# Patient Record
Sex: Female | Born: 1975 | Race: White | Hispanic: No | Marital: Married | Smoking: Former smoker
Health system: Southern US, Community
[De-identification: ages and names within clinical notes are randomized; demographics above are authoritative.]

## PROBLEM LIST (undated history)

## (undated) DIAGNOSIS — M1990 Unspecified osteoarthritis, unspecified site: Secondary | ICD-10-CM

## (undated) DIAGNOSIS — G4700 Insomnia, unspecified: Secondary | ICD-10-CM

## (undated) DIAGNOSIS — C801 Malignant (primary) neoplasm, unspecified: Secondary | ICD-10-CM

## (undated) DIAGNOSIS — E079 Disorder of thyroid, unspecified: Secondary | ICD-10-CM

## (undated) DIAGNOSIS — E063 Autoimmune thyroiditis: Secondary | ICD-10-CM

## (undated) DIAGNOSIS — M069 Rheumatoid arthritis, unspecified: Secondary | ICD-10-CM

## (undated) HISTORY — PX: CARPAL TUNNEL RELEASE: SHX101

## (undated) HISTORY — PX: CERVICAL FUSION: SHX112

## (undated) HISTORY — PX: SHOULDER DEBRIDEMENT: SHX1052

## (undated) HISTORY — PX: ABDOMINAL HYSTERECTOMY: SHX81

---

## 2004-08-06 ENCOUNTER — Emergency Department (HOSPITAL_COMMUNITY): Admission: EM | Admit: 2004-08-06 | Discharge: 2004-08-06 | Payer: Self-pay | Admitting: Emergency Medicine

## 2004-08-10 ENCOUNTER — Emergency Department (HOSPITAL_COMMUNITY): Admission: EM | Admit: 2004-08-10 | Discharge: 2004-08-10 | Payer: Self-pay | Admitting: Emergency Medicine

## 2005-09-28 ENCOUNTER — Other Ambulatory Visit: Admission: RE | Admit: 2005-09-28 | Discharge: 2005-09-28 | Payer: Self-pay | Admitting: Obstetrics and Gynecology

## 2006-01-05 ENCOUNTER — Ambulatory Visit (HOSPITAL_COMMUNITY): Admission: RE | Admit: 2006-01-05 | Discharge: 2006-01-05 | Payer: Self-pay | Admitting: Obstetrics and Gynecology

## 2006-01-18 ENCOUNTER — Inpatient Hospital Stay (HOSPITAL_COMMUNITY): Admission: AD | Admit: 2006-01-18 | Discharge: 2006-01-18 | Payer: Self-pay | Admitting: Obstetrics and Gynecology

## 2006-03-21 ENCOUNTER — Inpatient Hospital Stay (HOSPITAL_COMMUNITY): Admission: RE | Admit: 2006-03-21 | Discharge: 2006-03-21 | Payer: Self-pay | Admitting: Obstetrics and Gynecology

## 2006-03-31 ENCOUNTER — Inpatient Hospital Stay (HOSPITAL_COMMUNITY): Admission: AD | Admit: 2006-03-31 | Discharge: 2006-04-04 | Payer: Self-pay | Admitting: Obstetrics and Gynecology

## 2006-03-31 ENCOUNTER — Encounter (INDEPENDENT_AMBULATORY_CARE_PROVIDER_SITE_OTHER): Payer: Self-pay | Admitting: Specialist

## 2009-10-04 ENCOUNTER — Emergency Department (HOSPITAL_COMMUNITY): Admission: EM | Admit: 2009-10-04 | Discharge: 2009-10-04 | Payer: Self-pay | Admitting: Emergency Medicine

## 2010-11-05 LAB — CBC
HCT: 36.1 % (ref 36.0–46.0)
Hemoglobin: 12.7 g/dL (ref 12.0–15.0)
MCHC: 35.3 g/dL (ref 30.0–36.0)
MCV: 98.2 fL (ref 78.0–100.0)
Platelets: 236 10*3/uL (ref 150–400)
RBC: 3.68 MIL/uL — ABNORMAL LOW (ref 3.87–5.11)
RDW: 13 % (ref 11.5–15.5)
WBC: 8.3 10*3/uL (ref 4.0–10.5)

## 2010-11-05 LAB — POCT I-STAT, CHEM 8
BUN: 8 mg/dL (ref 6–23)
Calcium, Ion: 1.09 mmol/L — ABNORMAL LOW (ref 1.12–1.32)
Chloride: 109 mEq/L (ref 96–112)
Creatinine, Ser: 0.5 mg/dL (ref 0.4–1.2)
Glucose, Bld: 103 mg/dL — ABNORMAL HIGH (ref 70–99)
HCT: 38 % (ref 36.0–46.0)
Hemoglobin: 12.9 g/dL (ref 12.0–15.0)
Potassium: 3.6 mEq/L (ref 3.5–5.1)
Sodium: 138 mEq/L (ref 135–145)
TCO2: 22 mmol/L (ref 0–100)

## 2010-11-05 LAB — DIFFERENTIAL
Basophils Absolute: 0 10*3/uL (ref 0.0–0.1)
Basophils Relative: 0 % (ref 0–1)
Eosinophils Absolute: 0.1 10*3/uL (ref 0.0–0.7)
Eosinophils Relative: 1 % (ref 0–5)
Lymphocytes Relative: 14 % (ref 12–46)
Lymphs Abs: 1.2 10*3/uL (ref 0.7–4.0)
Monocytes Absolute: 0.6 10*3/uL (ref 0.1–1.0)
Monocytes Relative: 7 % (ref 3–12)
Neutro Abs: 6.5 10*3/uL (ref 1.7–7.7)
Neutrophils Relative %: 78 % — ABNORMAL HIGH (ref 43–77)

## 2010-11-05 LAB — POCT CARDIAC MARKERS
CKMB, poc: <1 ng/mL — ABNORMAL LOW (ref 1.0–8.0)
Myoglobin, poc: 27.7 ng/mL (ref 12–200)
Troponin i, poc: <0.05 ng/mL (ref 0.00–0.09)

## 2010-11-05 LAB — D-DIMER, QUANTITATIVE: D-Dimer, Quant: <0.22 ug/mL-FEU (ref 0.00–0.48)

## 2011-01-01 NOTE — Discharge Summary (Signed)
NAMESHANVI, MOYD                 ACCOUNT NO.:  [id number]   MEDICAL RECORD NO.:  [id number]          PATIENT TYPE:  INP   LOCATION:  9111                          FACILITY:  [facility]   PHYSICIAN:  Malachi Pro. Ambrose Mantle, M.D. DATE OF BIRTH:  27-Apr-1976   DATE OF ADMISSION:  03/31/2006  DATE OF DISCHARGE:  04/04/2006                                 DISCHARGE SUMMARY   This a 35 year old white female admitted for repeat cesarean section after  declining vaginal birth after cesarean.  She underwent low transverse  cervical cesarean section by Dr. Ambrose Mantle with Dr. Ellyn Hack assisting under  spinal anesthesia.  Did well postoperatively, passed flatus, had bowel  movements, tolerated a diet, voided well without difficulty, remained  afebrile.  Staples removed.  Strips were applied on the 4th postop day and  she was discharged.  She did not want to go home on the third postop day.  Laboratory data showed that the baby was Rh negative.  The patient was not a  candidate for RhoGAM.  Initial hemoglobin 11.5, hematocrit 33.5, white count  10,900, platelet count 353,000.  Follow-up hemoglobin 9.6.  Comprehensive  metabolic profile was essentially normal.  Urinalysis was negative.   FINAL DIAGNOSES:  1. Intrauterine pregnancy at 38-1/2 weeks.  2. Prior cesarean section, declined vaginal birth after cesarean,      delivered by low transverse cervical cesarean section.   OPERATION:  Low transverse cervical cesarean section.   FINAL CONDITION:  Improved.   INSTRUCTIONS:  Regular discharge instruction booklet.  Percocet 5/325, 24  tablets, one every four to six hours as needed for pain.  The patient is  advised to return to the office in 10-14 days for follow-up examination.  Call with any problems.  She is to receive the Depo Provera prior to  discharge.      Malachi Pro. Ambrose Mantle, M.D.  Electronically Signed     TFH/MEDQ  D:  04/04/2006  T:  04/04/2006  Job:  [id number]

## 2011-01-01 NOTE — Op Note (Signed)
Kristen York, Kristen York                 ACCOUNT NO.:  [id number]   MEDICAL RECORD NO.:  [id number]          PATIENT TYPE:  INP   LOCATION:  9199                          [facility]ACILITY:  [facility]   PHYSICIAN:  Malachi Pro. Ambrose Mantle, M.D. DATE OF BIRTH:  October 27, 1975   DATE OF PROCEDURE:  03/31/2006  DATE OF DISCHARGE:                                 OPERATIVE REPORT   PREOPERATIVE DIAGNOSIS:  Intrauterine pregnancy at term, declined vaginal  birth after previous cesarean section; desired repeat cesarean section.   POSTOPERATIVE DIAGNOSIS:  Intrauterine pregnancy at term, declined vaginal  birth after previous cesarean section; desired repeat cesarean section.   OPERATION:  Low transverse cervical cesarean section.   OPERATOR:  Malachi Pro. Ambrose Mantle, M.D.   ASSISTANT:  Sherron Monday, M.D.   ANESTHESIA:  Spinal.   DESCRIPTION OF PROCEDURE:  The patient was brought to the operating room and  given a spinal anesthetic by Dr. Tacy Dura.  Fetal heart tones were confirmed  to be normal.  The abdomen and urethra were prepped with Betadine solution.  A Foley catheter was inserted to straight drain.  The abdomen was then  draped as a sterile field.  The old incision was utilized.  It was difficult  to see because it had healed so beautifully, but it was utilized as much as  possible to enter through the skin, subcutaneous tissue, and fascia.  The  fascia was then separated from the rectus muscle superiorly and inferiorly.  The rectus muscle was split in the midline.  Peritoneum was opened  vertically.  I made an incision into the lower uterine segment after  confirming the position of the vertex below the incision.  I then entered  the amniotic sac, extended the incision in both directions upward and  outward with the bandage scissors.  The infant's mouth was just in the  incisional opening.  I lifted the vertex through the incisional opening.  There was one loop of nuchal cord.  The infant was delivered.  It was a  female infant, 6 pounds 1 ounce, with Apgars of 8 at one and 9 at five  minutes.  The cord was cut to try to preserve as much cord blood as  possible.  Short segment of the cord was preserved in case a pH was  necessary.  Routine cord blood studies were obtained and then the uterus was  massaged a little bit.  Pitocin was run.  The placenta was removed.  The  inside of the uterus was inspected and found to be free of debris.  The  cervix was dilated with a ring forceps.  The uterine incisional angles were  grasped and the uterus was closed in two layers using running locked suture  of 0 Vicryl in the first layer and nonlocking suture of the same material on  the second layer, 3-0 Vicryl on the peritoneum over the lower uterine  segment just for hemostasis.  Both tubes and ovaries appeared normal as did  the uterus.  The abdominal wall was then closed after all the blood was  removed from the peritoneal  cavity.  The peritoneum was closed with the  rectus muscle with interrupted sutures of 0 Vicryl.  The fascia was closed  with two running sutures of 0 Vicryl, the subcu  with a running 3-0 Vicryl, and the skin was closed with automatic staples.  The patient seemed to tolerate the procedure well.  Blood loss was estimated  at 500 cc.  Sponge and needle counts were correct and she was returned to  recovery in satisfactory condition.      Malachi Pro. Ambrose Mantle, M.D.  Electronically Signed     TFH/MEDQ  D:  03/31/2006  T:  03/31/2006  Job:  [id number]

## 2011-01-01 NOTE — H&P (Signed)
NAMECATERA, HANKINS                 ACCOUNT NO.:  [id number]   MEDICAL RECORD NO.:  [id number]          PATIENT TYPE:  MAT   LOCATION:  MATC                          [facility]ILITY:  [facility]   PHYSICIAN:  Malachi Pro. Ambrose Mantle, M.D. DATE OF BIRTH:  12/19/1975   DATE OF ADMISSION:  DATE OF DISCHARGE:                                HISTORY & PHYSICAL   HISTORY OF PRESENT ILLNESS:  This is a 35 year old white single female, para  1-0-1-1, gravida 3, EDC April 11, 2006, by ultrasound, admitted for repeat  C-section.  The patient declined vaginal birth after cesarean.  Vaginal  ultrasound on March 15, 2006, showed a crown rump length of 3.36 cm, 10 weeks  2 days, [facility] April 11, 2006.  Blood group and type A negative, negative  antibody, non-reactive serology, Rubella immune, hepatitis B surface antigen  negative, HIV declined, GC and Chlamydia negative, triple screen declined.  One hour Glucola 78.  Group B Strep negative.  Ultrasound done November 08, 2005, showed an average gestational age of [redacted] weeks 6 days with an [facility] of  April 12, 2006.  This patient's pregnancy was complicated by measurements  that suggested the baby was small for gestational age and an ultrasound on  February 28, 2006, showed the size of the baby at the 25th percentile and AFI  was normal.  She has had twice weekly non-stress tests to confirm good fetal  well being and they have been normal.  The patient requested early delivery  and on March 21, 2006, she underwent amniocentesis but the LS ratio was 1.8  to 1 and there was no PG.  At the present time, with [facility] of April 11, 2006,  we are proceeding with repeat C-section because of the patient's extreme  discomfort.   ALLERGIES:  No known drug allergies.   PAST SURGICAL HISTORY:  She did have a C-section in August 1998 with  delivery of a 6 pound 11 ounce female from breech presentation.   PAST MEDICAL HISTORY:  Migraines.  Anxiety.  Alcohol, tobacco and drugs, she  does smoke  cigarettes and has been advised to quit.   FAMILY HISTORY:  Mother with heart disease and high blood pressure.  Maternal grandparents with heart disease.  Maternal grandmother with high  blood pressure and breast cancer.  Mother also apparently has renal disease  and ovarian cancer.   PHYSICAL EXAMINATION:  GENERAL:  Well developed, well nourished, white female in no acute distress.  VITAL SIGNS:  Blood pressure at her last prenatal visit was 120/66, pulse  80, weight 165 1/2 pounds.  HEENT:  No cranial abnormalities.  Extraocular movements intact.  Nose and  pharynx clear.  NECK:  Supple without thyromegaly.  LUNGS:  Clear to auscultation.  HEART:  Normal size and sounds, no murmurs.  ABDOMEN:  Soft.  Fundal height on March 21, 2006, was 33 cm.  I could not  feel the cervix.  The head was low.   ADMITTING IMPRESSION:  Intrauterine pregnancy at 13 1/2 weeks for repeat C-  section.  The patient understands the risks  of surgery and is ready to  proceed.  She declined vaginal birth after cesarean.      Malachi Pro. Ambrose Mantle, M.D.  Electronically Signed     TFH/MEDQ  D:  03/30/2006  T:  03/30/2006  Job:  [id number]

## 2011-01-23 ENCOUNTER — Emergency Department (HOSPITAL_COMMUNITY)
Admission: EM | Admit: 2011-01-23 | Discharge: 2011-01-23 | Disposition: A | Discharge status: Home / Self Care | Payer: No Typology Code available for payment source | Attending: Emergency Medicine | Admitting: Emergency Medicine

## 2011-01-23 DIAGNOSIS — M546 Pain in thoracic spine: Secondary | ICD-10-CM | POA: Insufficient documentation

## 2011-01-23 DIAGNOSIS — Y9289 Other specified places as the place of occurrence of the external cause: Secondary | ICD-10-CM | POA: Insufficient documentation

## 2011-01-23 DIAGNOSIS — E039 Hypothyroidism, unspecified: Secondary | ICD-10-CM | POA: Insufficient documentation

## 2011-01-23 DIAGNOSIS — M25559 Pain in unspecified hip: Secondary | ICD-10-CM | POA: Insufficient documentation

## 2011-01-23 DIAGNOSIS — R51 Headache: Secondary | ICD-10-CM | POA: Insufficient documentation

## 2011-01-23 DIAGNOSIS — S8000XA Contusion of unspecified knee, initial encounter: Secondary | ICD-10-CM | POA: Insufficient documentation

## 2011-01-23 DIAGNOSIS — M545 Low back pain, unspecified: Secondary | ICD-10-CM | POA: Insufficient documentation

## 2011-01-23 DIAGNOSIS — S335XXA Sprain of ligaments of lumbar spine, initial encounter: Secondary | ICD-10-CM | POA: Insufficient documentation

## 2011-01-23 DIAGNOSIS — M25569 Pain in unspecified knee: Secondary | ICD-10-CM | POA: Insufficient documentation

## 2011-02-24 ENCOUNTER — Emergency Department (HOSPITAL_COMMUNITY): Payer: 59

## 2011-02-24 ENCOUNTER — Emergency Department (HOSPITAL_COMMUNITY)
Admission: EM | Admit: 2011-02-24 | Discharge: 2011-02-24 | Discharge status: Against Medical Advice | Payer: 59 | Attending: Emergency Medicine | Admitting: Emergency Medicine

## 2011-02-24 DIAGNOSIS — I959 Hypotension, unspecified: Secondary | ICD-10-CM | POA: Insufficient documentation

## 2011-02-24 DIAGNOSIS — E039 Hypothyroidism, unspecified: Secondary | ICD-10-CM | POA: Insufficient documentation

## 2011-02-24 DIAGNOSIS — E860 Dehydration: Secondary | ICD-10-CM | POA: Insufficient documentation

## 2011-02-24 DIAGNOSIS — N12 Tubulo-interstitial nephritis, not specified as acute or chronic: Secondary | ICD-10-CM | POA: Insufficient documentation

## 2011-02-24 DIAGNOSIS — R112 Nausea with vomiting, unspecified: Secondary | ICD-10-CM | POA: Insufficient documentation

## 2011-02-24 LAB — COMPREHENSIVE METABOLIC PANEL
AST: 15 U/L (ref 0–37)
Albumin: 3.1 g/dL — ABNORMAL LOW (ref 3.5–5.2)
Alkaline Phosphatase: 77 U/L (ref 39–117)
BUN: 8 mg/dL (ref 6–23)
CO2: 23 mEq/L (ref 19–32)
Calcium: 8.6 mg/dL (ref 8.4–10.5)
Chloride: 96 mEq/L (ref 96–112)
Creatinine, Ser: 0.63 mg/dL (ref 0.50–1.10)
GFR calc Af Amer: >60 mL/min (ref >60)
GFR calc non Af Amer: >60 mL/min (ref >60)
Glucose, Bld: 98 mg/dL (ref 70–99)
Potassium: 3.2 mEq/L — ABNORMAL LOW (ref 3.5–5.1)
Sodium: 131 mEq/L — ABNORMAL LOW (ref 135–145)
Total Bilirubin: 0.5 mg/dL (ref 0.3–1.2)
Total Protein: 7.3 g/dL (ref 6.0–8.3)

## 2011-02-24 LAB — CBC
HCT: 33.8 % — ABNORMAL LOW (ref 36.0–46.0)
Hemoglobin: 12.2 g/dL (ref 12.0–15.0)
MCH: 33.2 pg (ref 26.0–34.0)
MCHC: 36.1 g/dL — ABNORMAL HIGH (ref 30.0–36.0)
MCV: 92.1 fL (ref 78.0–100.0)
Platelets: 234 10*3/uL (ref 150–400)
RDW: 13.5 % (ref 11.5–15.5)
WBC: 14.5 10*3/uL — ABNORMAL HIGH (ref 4.0–10.5)

## 2011-02-24 LAB — DIFFERENTIAL
Basophils Absolute: 0 10*3/uL (ref 0.0–0.1)
Eosinophils Absolute: 0 10*3/uL (ref 0.0–0.7)
Eosinophils Relative: 0 % (ref 0–5)
Lymphocytes Relative: 7 % — ABNORMAL LOW (ref 12–46)
Lymphs Abs: 1.1 10*3/uL (ref 0.7–4.0)
Monocytes Relative: 15 % — ABNORMAL HIGH (ref 3–12)
Neutro Abs: 11.2 10*3/uL — ABNORMAL HIGH (ref 1.7–7.7)

## 2011-02-24 LAB — URINALYSIS, ROUTINE W REFLEX MICROSCOPIC
Ketones, ur: >80 mg/dL — AB
Nitrite: POSITIVE — AB
Protein, ur: 30 mg/dL — AB
Specific Gravity, Urine: 1.015 (ref 1.005–1.030)
Urobilinogen, UA: 4 mg/dL — ABNORMAL HIGH (ref 0.0–1.0)
pH: 6 (ref 5.0–8.0)

## 2011-02-24 LAB — URINE MICROSCOPIC-ADD ON

## 2011-02-24 LAB — LIPASE, BLOOD: Lipase: 14 U/L (ref 11–59)

## 2011-02-24 LAB — POCT PREGNANCY, URINE: Preg Test, Ur: NEGATIVE

## 2012-12-26 ENCOUNTER — Encounter: Payer: Self-pay | Admitting: *Deleted

## 2012-12-26 ENCOUNTER — Emergency Department (INDEPENDENT_AMBULATORY_CARE_PROVIDER_SITE_OTHER): Payer: 59

## 2012-12-26 ENCOUNTER — Emergency Department (INDEPENDENT_AMBULATORY_CARE_PROVIDER_SITE_OTHER)
Admission: EM | Admit: 2012-12-26 | Discharge: 2012-12-26 | Disposition: A | Discharge status: Home / Self Care | Payer: 59 | Source: Home / Self Care | Attending: Family Medicine | Admitting: Family Medicine

## 2012-12-26 DIAGNOSIS — M940 Chondrocostal junction syndrome [Tietze]: Secondary | ICD-10-CM

## 2012-12-26 DIAGNOSIS — R05 Cough: Secondary | ICD-10-CM

## 2012-12-26 DIAGNOSIS — R059 Cough, unspecified: Secondary | ICD-10-CM

## 2012-12-26 DIAGNOSIS — R0602 Shortness of breath: Secondary | ICD-10-CM

## 2012-12-26 HISTORY — DX: Disorder of thyroid, unspecified: E07.9

## 2012-12-26 MED ORDER — MELOXICAM 15 MG PO TABS: 15.0000 mg | ORAL_TABLET | Freq: Every day | ORAL | Status: DC

## 2012-12-26 NOTE — ED Provider Notes (Signed)
History     CSN: [id number]  Arrival date & time 12/26/12  1505   First MD Initiated Contact with Patient 12/26/12 1528      Chief Complaint  Patient presents with  . Shortness of Breath  . Cough   HPI  Cough, SOB, upper back pain x 3 days.  Initially noticed upper back pain after playing with kids.  Pain persisted to involve anterior chest.  Pain worse with deep breathing and movement.  Improved by sitting upright.  No radiation of pain to neck or jaw.  Pain more of an ache.  Also with mild cough.  No wheezing or SOB. Noted prior 19 pack year smoking history.     Past Medical History  Diagnosis Date  . Thyroid disease     Past Surgical History  Procedure Laterality Date  . Cesarean section      Family History  Problem Relation Age of Onset  . Hypertension Mother   . Heart failure Mother   . Cancer Mother     ovarian  . Hypertension Father   . Alcoholism Father     History  Substance Use Topics  . Smoking status: Former Games developer  . Smokeless tobacco: Not on file  . Alcohol Use: Yes    OB History   Grav Para Term Preterm Abortions TAB SAB Ect Mult Living                  Review of Systems  All other systems reviewed and are negative.    Allergies  Bee venom  Home Medications   Current Outpatient Rx  Name  Route  Sig  Dispense  Refill  . levothyroxine (SYNTHROID, LEVOTHROID) 75 MCG tablet   Oral   Take 75 mcg by mouth daily before breakfast.         . varenicline (CHANTIX) 1 MG tablet   Oral   Take 1 mg by mouth 2 (two) times daily.           BP 113/71  Pulse 70  Temp(Src) 98.4 F (36.9 C) (Oral)  Resp 16  Ht 5\' 7"  (1.702 m)  Wt 155 lb (70.308 kg)  BMI 24.27 kg/m2  SpO2 99%  LMP 11/29/2012  Physical Exam  Constitutional: She appears well-developed and well-nourished.  HENT:  Head: Normocephalic and atraumatic.  Right Ear: External ear normal.  Eyes: Conjunctivae are normal. Pupils are equal, round, and reactive to  light.  Neck: Normal range of motion.  Cardiovascular: Normal rate, regular rhythm and normal heart sounds.   Pulmonary/Chest: Effort normal and breath sounds normal.  + anterior chest wall TTP  + mild pain with deep breathing.    Musculoskeletal: Normal range of motion.  Neurological: She is alert.  Skin: Skin is warm.    ED Course  Procedures (including critical care time)  Labs Reviewed - No data to display Dg Chest 2 View  12/26/2012  *RADIOLOGY REPORT*  Clinical Data: Shortness of breath, cough, fever, former smoking history  CHEST - 2 VIEW  Comparison: Chest x-ray of 10/04/2009  Findings: No active infiltrate or effusion is seen.  Mediastinal contours appear stable.  The heart is within normal limits in size. No bony abnormality is seen.  IMPRESSION: Stable chest x-ray.  No active lung disease.   Original Report Authenticated By: Dwyane Dee, M.D.      1. Costochondritis       MDM  Exam clinically consistent with costochondritis. Overlapping mild URI. Chest x-ray within normal limits.  Placed on NSAIDs. Discussed general, MSK, thoracic red flags. Otherwise followup as needed    The patient and/or caregiver has been counseled thoroughly with regard to treatment plan and/or medications prescribed including dosage, schedule, interactions, rationale for use, and possible side effects and they verbalize understanding. Diagnoses and expected course of recovery discussed and will return if not improved as expected or if the condition worsens. Patient and/or caregiver verbalized understanding.             Doree Albee, MD 12/26/12 [id number]

## 2012-12-26 NOTE — ED Notes (Signed)
Pt c/o cough, SOB and LT upper back x 3 days. She reports a fever on and off of up to 102. No OTC meds.

## 2013-04-23 DIAGNOSIS — F329 Major depressive disorder, single episode, unspecified: Secondary | ICD-10-CM | POA: Insufficient documentation

## 2013-08-13 DIAGNOSIS — G5602 Carpal tunnel syndrome, left upper limb: Secondary | ICD-10-CM | POA: Insufficient documentation

## 2015-08-20 DIAGNOSIS — E039 Hypothyroidism, unspecified: Secondary | ICD-10-CM | POA: Insufficient documentation

## 2015-08-20 DIAGNOSIS — M545 Low back pain, unspecified: Secondary | ICD-10-CM | POA: Insufficient documentation

## 2015-08-20 DIAGNOSIS — G8929 Other chronic pain: Secondary | ICD-10-CM | POA: Insufficient documentation

## 2015-08-21 DIAGNOSIS — G43009 Migraine without aura, not intractable, without status migrainosus: Secondary | ICD-10-CM | POA: Insufficient documentation

## 2016-03-15 DIAGNOSIS — M797 Fibromyalgia: Secondary | ICD-10-CM | POA: Insufficient documentation

## 2016-04-23 DIAGNOSIS — E669 Obesity, unspecified: Secondary | ICD-10-CM | POA: Insufficient documentation

## 2016-07-18 ENCOUNTER — Encounter: Payer: Self-pay | Admitting: Emergency Medicine

## 2016-07-18 ENCOUNTER — Emergency Department (INDEPENDENT_AMBULATORY_CARE_PROVIDER_SITE_OTHER)
Admission: EM | Admit: 2016-07-18 | Discharge: 2016-07-18 | Disposition: A | Discharge status: Home / Self Care | Source: Home / Self Care | Attending: Family Medicine | Admitting: Family Medicine

## 2016-07-18 DIAGNOSIS — J069 Acute upper respiratory infection, unspecified: Secondary | ICD-10-CM

## 2016-07-18 HISTORY — DX: Insomnia, unspecified: G47.00

## 2016-07-18 HISTORY — DX: Malignant (primary) neoplasm, unspecified: C80.1

## 2016-07-18 HISTORY — DX: Unspecified osteoarthritis, unspecified site: M19.90

## 2016-07-18 LAB — POCT RAPID STREP A (OFFICE): Rapid Strep A Screen: NEGATIVE

## 2016-07-18 MED ORDER — AZITHROMYCIN 250 MG PO TABS: 250.0000 mg | ORAL_TABLET | Freq: Every day | ORAL | 0 refills | Status: DC

## 2016-07-18 NOTE — ED Provider Notes (Signed)
CSN: [id number]     Arrival date & time 07/18/16  1754 History   First MD Initiated Contact with Patient 07/18/16 1824     Chief Complaint  Patient presents with  . Fever  . Sore Throat  . Cough  . Nasal Congestion   (Consider location/radiation/quality/duration/timing/severity/associated sxs/prior Treatment) HPI  DEVORIA SCHEIER is a 40 y.o. female presenting to UC with c/o 6 days of gradually worsening productive cough with nasal congestion, fever of 102*F yesterday, body aches and fatigue. She has been taking ibuprofen every 4 hours.  Denies n/v/d. Others have been sick around her.  Denies hx of asthma. Denies chest pain or SOB.    Past Medical History:  Diagnosis Date  . Cancer ([place])   . Insomnia   . Osteoarthritis   . Thyroid disease    Past Surgical History:  Procedure Laterality Date  . CARPAL TUNNEL RELEASE    . CESAREAN SECTION     Family History  Problem Relation Age of Onset  . Hypertension Mother   . Heart failure Mother   . Cancer Mother     ovarian  . Hypertension Father   . Alcoholism Father    Social History  Substance Use Topics  . Smoking status: Former Research scientist (life sciences)  . Smokeless tobacco: Never Used  . Alcohol use Yes   OB History    No data available     Review of Systems  Constitutional: Positive for chills, fatigue and fever.  HENT: Positive for congestion, postnasal drip, rhinorrhea and sore throat. Negative for ear pain, trouble swallowing and voice change.   Respiratory: Positive for cough. Negative for shortness of breath.   Cardiovascular: Negative for chest pain and palpitations.  Gastrointestinal: Negative for abdominal pain, diarrhea, nausea and vomiting.  Musculoskeletal: Negative for arthralgias, back pain and myalgias.  Skin: Negative for rash.  Neurological: Positive for headaches. Negative for dizziness and light-headedness.    Allergies  Bee venom  Home Medications   Prior to Admission medications   Medication Sig Start Date  End Date Taking? Authorizing Provider  DULoxetine (CYMBALTA) 60 MG capsule Take 60 mg by mouth daily.   Yes Historical Provider, MD  norethindrone-ethinyl estradiol-iron (ESTROSTEP FE,TILIA FE,TRI-LEGEST FE) 1-20/1-30/1-35 MG-MCG tablet Take 1 tablet by mouth daily.   Yes Historical Provider, MD  pregabalin (LYRICA) 300 MG capsule Take 300 mg by mouth 2 (two) times daily.   Yes Historical Provider, MD  azithromycin (ZITHROMAX) 250 MG tablet Take 1 tablet (250 mg total) by mouth daily. Take first 2 tablets together, then 1 every day until finished. 07/18/16   Noland Fordyce, PA-C  levothyroxine (SYNTHROID, LEVOTHROID) 75 MCG tablet Take 75 mcg by mouth daily before breakfast.    Historical Provider, MD  meloxicam (MOBIC) 15 MG tablet Take 1 tablet (15 mg total) by mouth daily. 12/26/12   Deneise Lever, MD  varenicline (CHANTIX) 1 MG tablet Take 1 mg by mouth 2 (two) times daily.    Historical Provider, MD   Meds Ordered and Administered this Visit  Medications - No data to display  BP 105/70 (BP Location: Left Arm)   Pulse 86   Temp 98.1 F (36.7 C) (Oral)   Resp 16   Ht 5\' 7"  (1.702 m)   Wt 214 lb (97.1 kg)   LMP 07/10/2016 (Exact Date)   SpO2 100%   BMI 33.52 kg/m  No data found.   Physical Exam  Constitutional: She is oriented to person, place, and time. She appears well-developed  and well-nourished. No distress.  HENT:  Head: Normocephalic and atraumatic.  Right Ear: Tympanic membrane normal.  Left Ear: Tympanic membrane normal.  Nose: Mucosal edema present. Right sinus exhibits no maxillary sinus tenderness and no frontal sinus tenderness. Left sinus exhibits no maxillary sinus tenderness and no frontal sinus tenderness.  Mouth/Throat: Uvula is midline, oropharynx is clear and moist and mucous membranes are normal.  Eyes: EOM are normal.  Neck: Normal range of motion. Neck supple.  Cardiovascular: Normal rate and regular rhythm.   Pulmonary/Chest: Effort normal and breath  sounds normal. No stridor. No respiratory distress. She has no wheezes. She has no rales.  Abdominal: Soft. She exhibits no distension. There is no tenderness.  Musculoskeletal: Normal range of motion.  Lymphadenopathy:    She has no cervical adenopathy.  Neurological: She is alert and oriented to person, place, and time.  Skin: Skin is warm and dry. She is not diaphoretic.  Psychiatric: She has a normal mood and affect. Her behavior is normal.  Nursing note and vitals reviewed.   Urgent Care Course   Clinical Course     Procedures (including critical care time)  Labs Review Labs Reviewed  POCT RAPID STREP A (OFFICE)    Imaging Review No results found.   MDM   1. Upper respiratory tract infection, unspecified type    Pt c/o 6 days of worsening URI symptoms, with associated fever of 102*F that started yesterday.  Pt is afebrile in UC. No respiratory distress.  Due to worsening symptoms and onset of fever, will cover for bacterial cause. Rx: Azithromycin Pt declined tessalon F/u with PCP in 1 week if not improving.     Noland Fordyce, PA-C 07/20/16 1236

## 2016-07-18 NOTE — ED Triage Notes (Signed)
Gives 6 day history of cough and congestion; yesterday spiked fever 102; taking ibuprofen q 4 hours.

## 2017-01-13 DIAGNOSIS — K5903 Drug induced constipation: Secondary | ICD-10-CM | POA: Insufficient documentation

## 2017-01-13 DIAGNOSIS — T402X5A Adverse effect of other opioids, initial encounter: Secondary | ICD-10-CM

## 2017-09-15 DIAGNOSIS — G8929 Other chronic pain: Secondary | ICD-10-CM | POA: Insufficient documentation

## 2017-09-15 DIAGNOSIS — M25511 Pain in right shoulder: Secondary | ICD-10-CM

## 2017-10-19 ENCOUNTER — Other Ambulatory Visit: Payer: Self-pay | Admitting: Orthopaedic Surgery

## 2017-10-19 DIAGNOSIS — M545 Low back pain: Secondary | ICD-10-CM

## 2017-10-26 ENCOUNTER — Ambulatory Visit
Admission: RE | Admit: 2017-10-26 | Discharge: 2017-10-26 | Disposition: A | Discharge status: Home / Self Care | Payer: BLUE CROSS/BLUE SHIELD | Source: Ambulatory Visit | Attending: Orthopaedic Surgery | Admitting: Orthopaedic Surgery

## 2017-10-26 DIAGNOSIS — M545 Low back pain: Secondary | ICD-10-CM

## 2018-06-14 ENCOUNTER — Other Ambulatory Visit: Payer: Self-pay | Admitting: Orthopaedic Surgery

## 2018-06-14 DIAGNOSIS — M4726 Other spondylosis with radiculopathy, lumbar region: Secondary | ICD-10-CM | POA: Insufficient documentation

## 2018-06-14 DIAGNOSIS — M50022 Cervical disc disorder at C5-C6 level with myelopathy: Secondary | ICD-10-CM

## 2018-06-26 ENCOUNTER — Other Ambulatory Visit: Payer: BLUE CROSS/BLUE SHIELD

## 2018-06-26 ENCOUNTER — Inpatient Hospital Stay
Admission: RE | Admit: 2018-06-26 | Discharge: 2018-06-26 | Disposition: A | Discharge status: Home / Self Care | Payer: BLUE CROSS/BLUE SHIELD | Source: Ambulatory Visit | Attending: Orthopaedic Surgery | Admitting: Orthopaedic Surgery

## 2018-06-26 NOTE — Discharge Instructions (Signed)
Myelogram Discharge Instructions  1. Go home and rest quietly for the next 24 hours.  It is important to lie flat for the next 24 hours.  Get up only to go to the restroom.  You may lie in the bed or on a couch on your back, your stomach, your left side or your right side.  You may have one pillow under your head.  You may have pillows between your knees while you are on your side or under your knees while you are on your back.  2. DO NOT drive today.  Recline the seat as far back as it will go, while still wearing your seat belt, on the way home.  3. You may get up to go to the bathroom as needed.  You may sit up for 10 minutes to eat.  You may resume your normal diet and medications unless otherwise indicated.  Drink lots of extra fluids today and tomorrow.  4. The incidence of headache, nausea, or vomiting is about 5% (one in 20 patients).  If you develop a headache, lie flat and drink plenty of fluids until the headache goes away.  Caffeinated beverages may be helpful.  If you develop severe nausea and vomiting or a headache that does not go away with flat bed rest, call [phone].  5. You may resume normal activities after your 24 hours of bed rest is over; however, do not exert yourself strongly or do any heavy lifting tomorrow. If when you get up you have a headache when standing, go back to bed and force fluids for another 24 hours.  6. Call your physician for a follow-up appointment.  The results of your myelogram will be sent directly to your physician by the following day.  7. If you have any questions or if complications develop after you arrive home, please call [phone].  Discharge instructions have been explained to the patient.  The patient, or the person responsible for the patient, fully understands these instructions.  YOU MAY RESTART YOUR ZOLOFT TOMORROW 06/27/2018 AT 10:30AM.

## 2018-07-10 ENCOUNTER — Ambulatory Visit
Admission: RE | Admit: 2018-07-10 | Discharge: 2018-07-10 | Disposition: A | Discharge status: Home / Self Care | Source: Ambulatory Visit | Attending: Orthopaedic Surgery | Admitting: Orthopaedic Surgery

## 2018-07-10 DIAGNOSIS — M50022 Cervical disc disorder at C5-C6 level with myelopathy: Secondary | ICD-10-CM

## 2018-07-10 MED ORDER — IOPAMIDOL (ISOVUE-M 300) INJECTION 61%
10.0000 mL | Freq: Once | INTRAMUSCULAR | Status: AC | PRN
  Administered 2018-07-10: 10 mL via INTRATHECAL

## 2018-07-10 MED ORDER — DIAZEPAM 5 MG PO TABS: 10.0000 mg | ORAL_TABLET | Freq: Once | ORAL | Status: DC

## 2018-07-10 MED ORDER — ONDANSETRON HCL 4 MG/2ML IJ SOLN: 4.0000 mg | Freq: Four times a day (QID) | INTRAMUSCULAR | Status: DC | PRN

## 2018-07-10 MED ORDER — ONDANSETRON HCL 4 MG/2ML IJ SOLN
4.0000 mg | Freq: Once | INTRAMUSCULAR | Status: AC
  Administered 2018-07-10: 4 mg via INTRAMUSCULAR

## 2018-07-10 MED ORDER — MEPERIDINE HCL 50 MG/ML IJ SOLN
50.0000 mg | Freq: Once | INTRAMUSCULAR | Status: AC
  Administered 2018-07-10: 50 mg via INTRAMUSCULAR

## 2018-07-10 NOTE — Discharge Instructions (Signed)

## 2020-02-28 ENCOUNTER — Other Ambulatory Visit: Payer: Self-pay | Admitting: Orthopaedic Surgery

## 2020-02-28 ENCOUNTER — Telehealth: Payer: Self-pay

## 2020-02-28 DIAGNOSIS — M546 Pain in thoracic spine: Secondary | ICD-10-CM

## 2020-02-28 DIAGNOSIS — S134XXD Sprain of ligaments of cervical spine, subsequent encounter: Secondary | ICD-10-CM

## 2020-02-28 NOTE — Telephone Encounter (Signed)
Called patient to review her medications and to explain procedure before she is scheduled for a cervical and thoracic myelogram.  She states an understanding she will be here two hours, will need a driver and will need to be on strict bedrest (explained) for 24 hours after the myelogram.  She also states an understanding she will need to hold Imipramine (Tofranil), Vyvanse and Viibryd for 48 hours before, and 24 hours after, the myelogram.

## 2020-03-14 ENCOUNTER — Ambulatory Visit
Admission: RE | Admit: 2020-03-14 | Discharge: 2020-03-14 | Disposition: A | Discharge status: Home / Self Care | Source: Ambulatory Visit | Attending: Orthopaedic Surgery | Admitting: Orthopaedic Surgery

## 2020-03-14 ENCOUNTER — Other Ambulatory Visit: Payer: Self-pay

## 2020-03-14 DIAGNOSIS — M546 Pain in thoracic spine: Secondary | ICD-10-CM

## 2020-03-14 DIAGNOSIS — S134XXD Sprain of ligaments of cervical spine, subsequent encounter: Secondary | ICD-10-CM

## 2020-03-14 MED ORDER — IOPAMIDOL (ISOVUE-M 300) INJECTION 61%
10.0000 mL | Freq: Once | INTRAMUSCULAR | Status: AC | PRN
  Administered 2020-03-14: 10 mL via INTRATHECAL

## 2020-03-14 MED ORDER — DIAZEPAM 5 MG PO TABS
5.0000 mg | ORAL_TABLET | Freq: Once | ORAL | Status: AC
  Administered 2020-03-14: 5 mg via ORAL

## 2020-03-14 NOTE — Discharge Instructions (Signed)
Myelogram Discharge Instructions  1. Go home and rest quietly for the next 24 hours.  It is important to lie flat for the next 24 hours.  Get up only to go to the restroom.  You may lie in the bed or on a couch on your back, your stomach, your left side or your right side.  You may have one pillow under your head.  You may have pillows between your knees while you are on your side or under your knees while you are on your back.  2. DO NOT drive today.  Recline the seat as far back as it will go, while still wearing your seat belt, on the way home.  3. You may get up to go to the bathroom as needed.  You may sit up for 10 minutes to eat.  You may resume your normal diet and medications unless otherwise indicated.  Drink lots of extra fluids today and tomorrow.  4. The incidence of headache, nausea, or vomiting is about 5% (one in 20 patients).  If you develop a headache, lie flat and drink plenty of fluids until the headache goes away.  Caffeinated beverages may be helpful.  If you develop severe nausea and vomiting or a headache that does not go away with flat bed rest, call [phone].  5. You may resume normal activities after your 24 hours of bed rest is over; however, do not exert yourself strongly or do any heavy lifting tomorrow. If when you get up you have a headache when standing, go back to bed and force fluids for another 24 hours.  6. Call your physician for a follow-up appointment.  The results of your myelogram will be sent directly to your physician by the following day.  7. If you have any questions or if complications develop after you arrive home, please call [phone].  Discharge instructions have been explained to the patient.  The patient, or the person responsible for the patient, fully understands these instructions.  YOU MAY RESTART YOUR TOFRANIL, VYVANSE AND VIIBRYD TOMORROW 03/15/20 AT 10:30AM.

## 2020-03-14 NOTE — Progress Notes (Signed)
Pt reported she has been off of her Tofranil, Vyvance, and Viibryd for at least 48 hours.

## 2022-05-23 IMAGING — XA DG MYELOGRAPHY LUMBAR INJ MULTI REGION
13 series · 13 of 13 positions shown · non-contrast
Comparison: Cervical myelogram 07/10/2018

CLINICAL DATA: Right greater than left neck pain. Right
periscapular pain. Right upper extremity pain and numbness.
TECHNIQUE: Contiguous axial images were obtained through the Cervical and
Thoracic spine after the intrathecal infusion of infusion. Coronal
and sagittal reconstructions were obtained of the axial image sets.

[Series 1: vasc standard · 1 of 1 slices shown (1 of 13)]
[im 1/1]
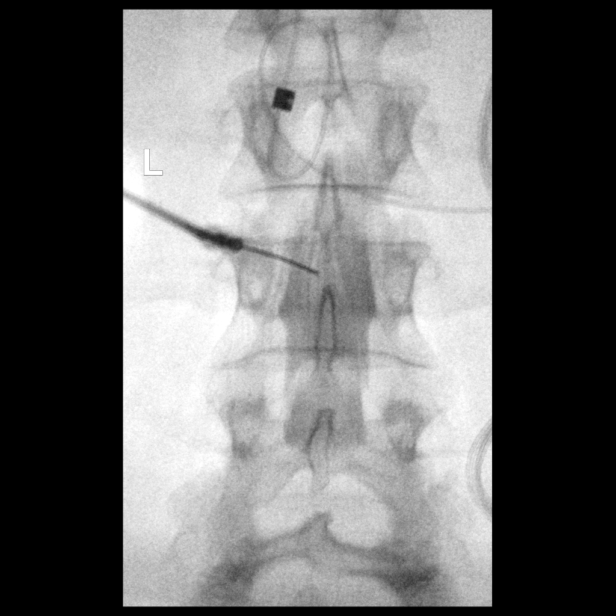

[Series 2: vasc standard · 1 of 1 slices shown (2 of 13)]
[im 1/1]
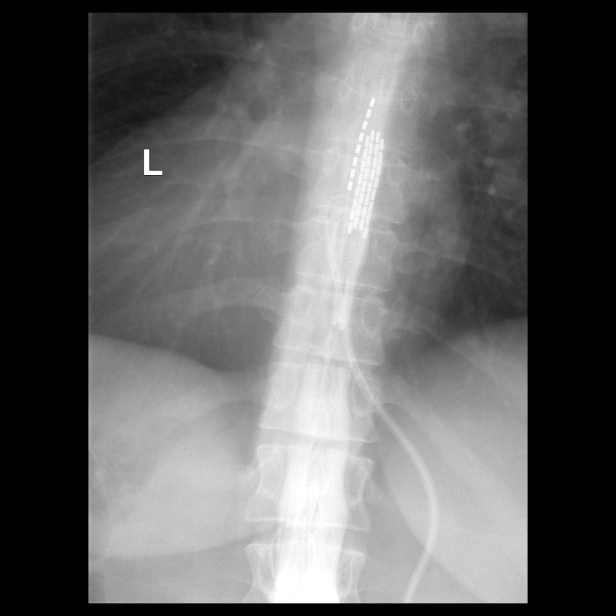

[Series 3: vasc standard · 1 of 1 slices shown (3 of 13)]
[im 1/1]
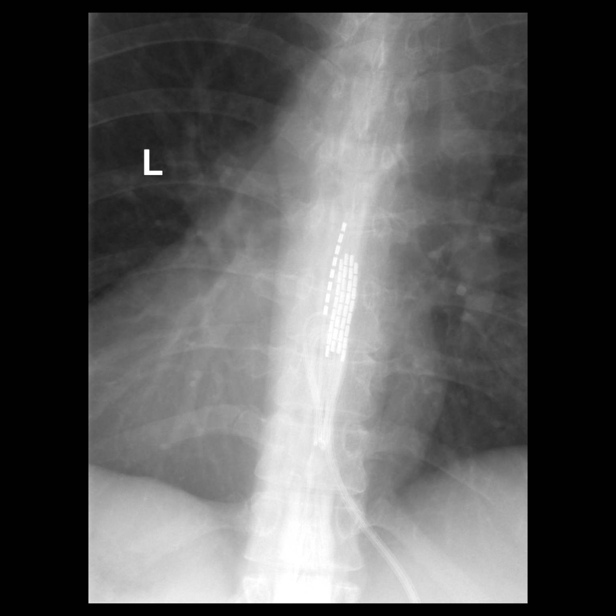

[Series 4: vasc standard · 1 of 1 slices shown (4 of 13)]
[im 1/1]
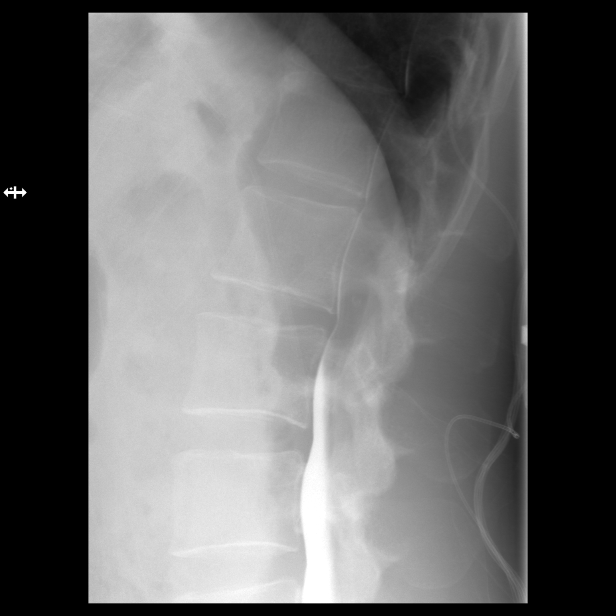

[Series 5: vasc standard · 1 of 1 slices shown (5 of 13)]
[im 1/1]
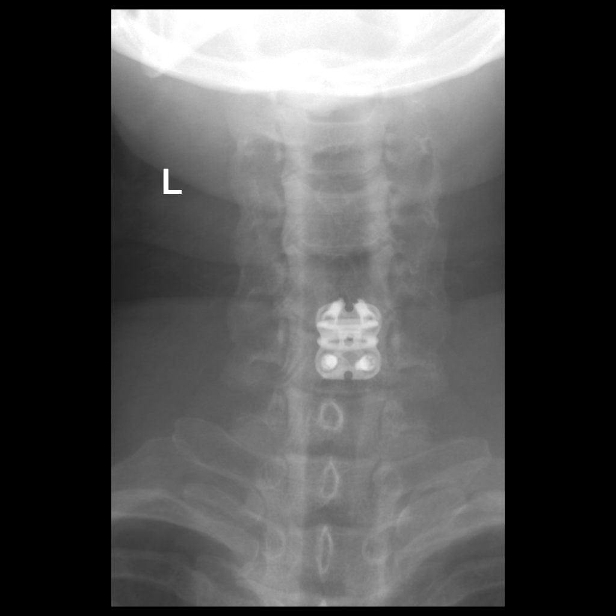

[Series 6: vasc standard · 1 of 1 slices shown (6 of 13)]
[im 1/1]
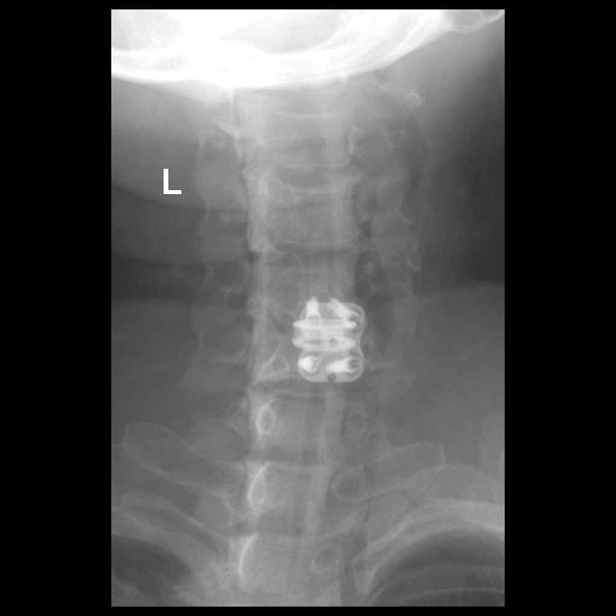

[Series 7: vasc standard · 1 of 1 slices shown (7 of 13)]
[im 1/1]
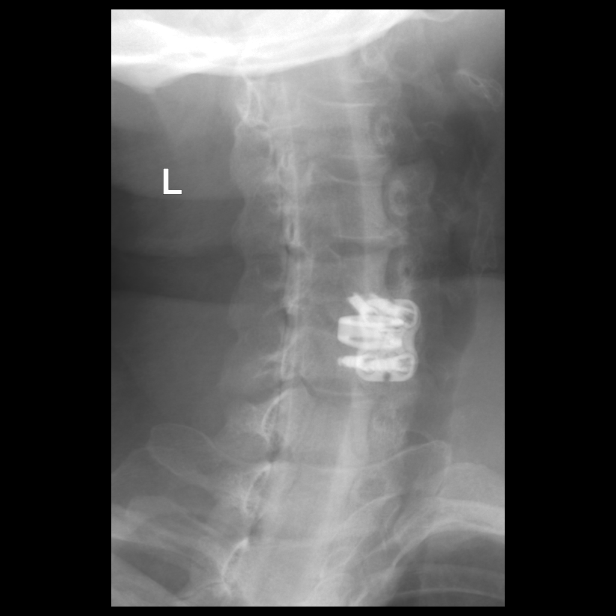

[Series 8: vasc standard · 1 of 1 slices shown (8 of 13)]
[im 1/1]
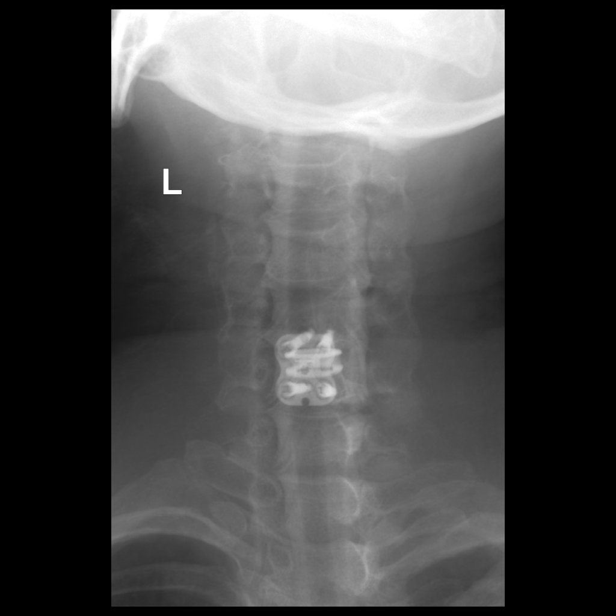

[Series 9: vasc standard · 1 of 1 slices shown (9 of 13)]
[im 1/1]
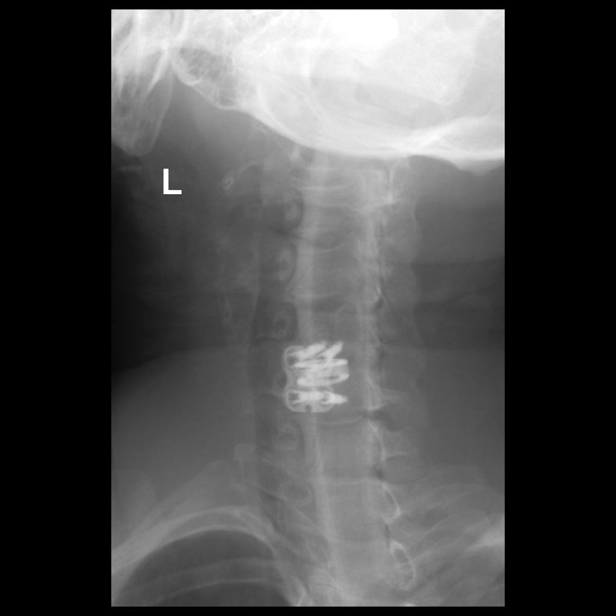

[Series 10: vasc standard · 1 of 1 slices shown (10 of 13)]
[im 1/1]
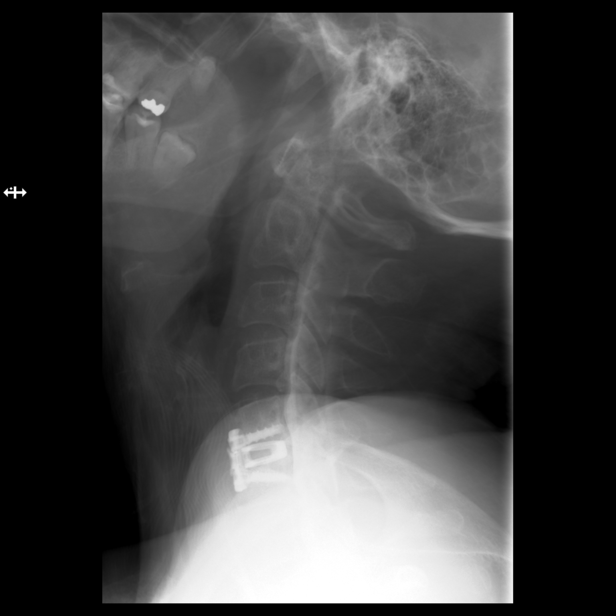

[Series 11: vasc standard · 1 of 1 slices shown (11 of 13)]
[im 1/1]
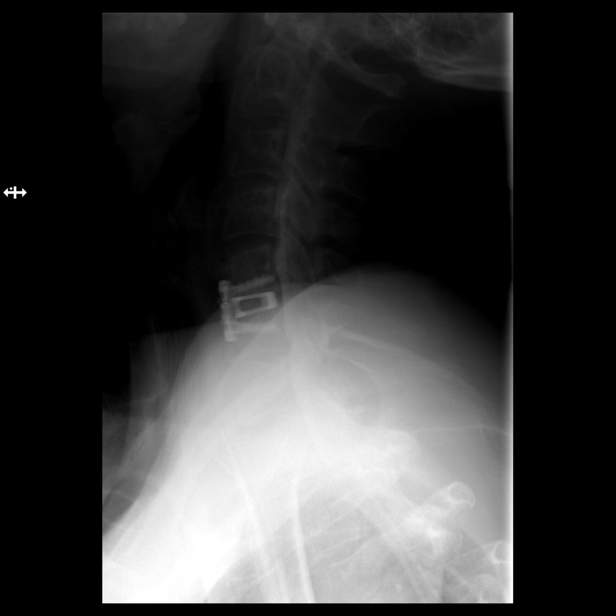

[Series 13: vasc standard · 1 of 1 slices shown (12 of 13)]
[im 1/1]
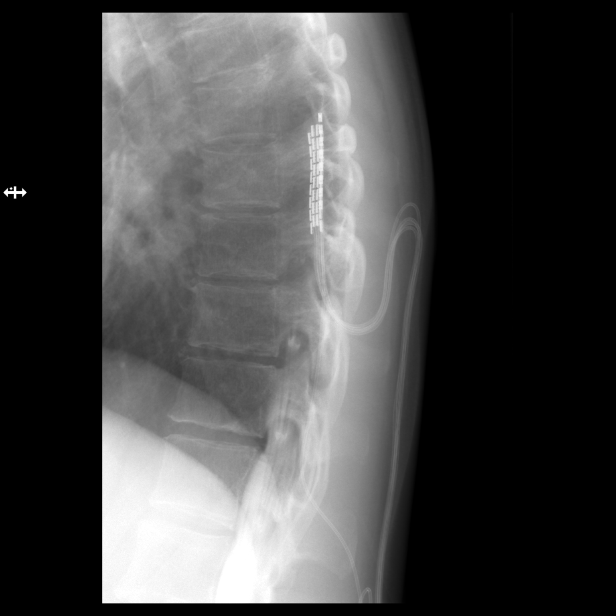

[Series 14: vasc standard · 1 of 1 slices shown (13 of 13)]
[im 1/1]
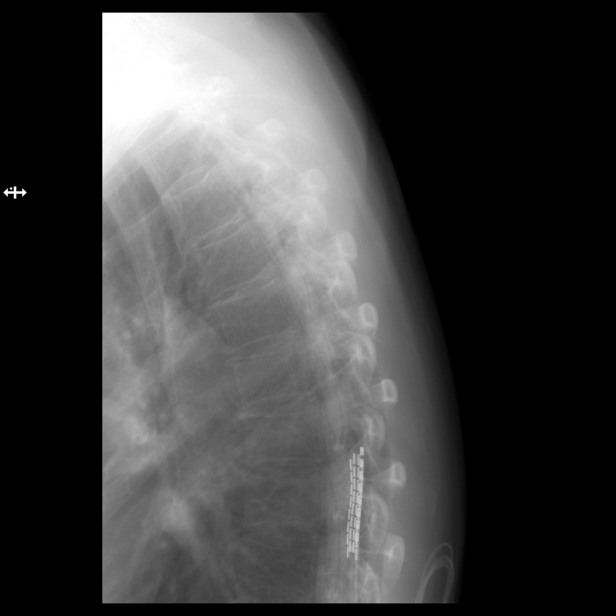

[13 of 13 positions shown; findings below may reference images not displayed]

FLUOROSCOPY TIME:  Fluoroscopy Time: 1 minute 11 seconds

Radiation Exposure Index: 289.52 microGray*m^2

PROCEDURE:
LUMBAR PUNCTURE FOR CERVICAL AND THORACIC MYELOGRAM

After thorough discussion of risks and benefits of the procedure
including bleeding, infection, injury to nerves, blood vessels,
adjacent structures as well as headache and CSF leak, written and
oral informed consent was obtained. Consent was obtained by Dr.
Bonois Est.

Patient was positioned prone on the fluoroscopy table. Local
anesthesia was provided with 1% lidocaine without epinephrine after
prepped and draped in the usual sterile fashion. Puncture was
performed at L2-3 using a 3 1/2 inch 22-gauge spinal needle via a
left interlaminar approach. Using a single pass through the dura,
the needle was placed within the thecal sac, with return of clear
CSF. 10 mL of Isovue W-KMM was injected into the thecal sac, with
normal opacification of the nerve roots and cauda equina consistent
with free flow within the subarachnoid space. The patient was then
moved to the trendelenburg position and contrast flowed into the
Thoracic spine region.

I personally performed the lumbar puncture and administered the
intrathecal contrast. I also personally supervised acquisition of
the myelogram images.
FINDINGS: CERVICAL AND THORACIC MYELOGRAM FINDINGS:

Vertebral alignment is normal. There has been interval ACDF at C5-6
with wide patency of the spinal canal at this level. There are small
ventral extradural defects at C4-5 and C6-7 without evidence of
significant spinal stenosis. There may be some underfilling of the
right C7 nerve root sleeve although the overlying fusion plate,
screws, and interbody implant limit assessment. A spinal cord
stimulator terminates at T7. No sizable extradural defect or spinal
stenosis is evident in the thoracic spine.

CT CERVICAL MYELOGRAM FINDINGS:

There is chronic straightening of the normal cervical lordosis
without listhesis. No fracture or suspicious osseous lesion is
evident. Sequelae of interval C5-6 ACDF are identified with solid
arthrodesis. Normal variant incomplete fusion of the posterior C1
ring is again noted. The cervical spinal cord is normal in caliber.
The paraspinal soft tissues are unremarkable.

C2-3 and C3-4: Negative.

C4-5: Unchanged trace disc bulging without stenosis.

C5-6: Interval ACDF. Mild uncovertebral spurring without significant
residual stenosis.

C6-7: Mild disc space narrowing. Mild disc bulging and asymmetric
right uncovertebral spurring result in mild right neural foraminal
stenosis, slightly progressed from prior. No spinal stenosis.

C7-T1: Negative.

CT THORACIC MYELOGRAM FINDINGS:

Vertebral alignment is normal. No fracture or suspicious osseous
lesion is identified. Spinal cord stimulator leads terminate in the
dorsal spinal canal at T7. The thoracic spinal cord is normal in
caliber. The paraspinal soft tissues are unremarkable.

There is a shallow right paracentral disc protrusion at T5-6 without
stenosis. Mild disc bulging at T10-11 and T11-12 does not result in
significant stenosis. There is moderate facet arthrosis on the left
at T2-3 and bilaterally at T3-4 and T4-5 with associated mild neural
foraminal stenosis.
IMPRESSION: 1. C5-6 ACDF without residual stenosis.
2. Mild right neural foraminal stenosis at C6-7.
3. Mild neural foraminal stenosis at T2-3, T3-4, and T4-5 due to
facet arthrosis.
4. No cervical or thoracic spinal canal stenosis.

## 2023-12-21 ENCOUNTER — Ambulatory Visit (INDEPENDENT_AMBULATORY_CARE_PROVIDER_SITE_OTHER)

## 2023-12-21 ENCOUNTER — Ambulatory Visit: Admission: EM | Admit: 2023-12-21 | Discharge: 2023-12-21 | Disposition: A | Discharge status: Home / Self Care

## 2023-12-21 ENCOUNTER — Other Ambulatory Visit: Payer: Self-pay

## 2023-12-21 DIAGNOSIS — M25572 Pain in left ankle and joints of left foot: Secondary | ICD-10-CM | POA: Diagnosis not present

## 2023-12-21 DIAGNOSIS — S93402A Sprain of unspecified ligament of left ankle, initial encounter: Secondary | ICD-10-CM | POA: Diagnosis not present

## 2023-12-21 DIAGNOSIS — M79672 Pain in left foot: Secondary | ICD-10-CM

## 2023-12-21 DIAGNOSIS — S9702XA Crushing injury of left ankle, initial encounter: Secondary | ICD-10-CM | POA: Diagnosis not present

## 2023-12-21 HISTORY — DX: Autoimmune thyroiditis: E06.3

## 2023-12-21 HISTORY — DX: Rheumatoid arthritis, unspecified: M06.9

## 2023-12-21 MED ORDER — HYDROCODONE-ACETAMINOPHEN 5-325 MG PO TABS: 1.0000 | ORAL_TABLET | Freq: Three times a day (TID) | ORAL | 0 refills | Status: AC | PRN

## 2023-12-21 NOTE — Discharge Instructions (Addendum)
 Your x-rays showed no broken bones or dislocations. They were sent to a radiologist for further evaluation and we will call you if the radiologist sees anything different.   Recommend rest, ice, compression, and elevation. You can alternate Tylenol and NSAIDs (Ibuprofen, Alleve) as needed for pain.   You have an ultrasound schedule for tomorrow at 2:00pm at [facility] to look for any blood clots.   If no improvement within the next week, you should follow up with orthopedics.   It is very important for you to pay attention to any new symptoms or worsening of your current condition. Please go directly to the Emergency Department immediately should you begin to feel worse in any way or have any of the following symptoms: fevers, increased pain, increased swelling, increased redness, or color changes.   You were prescribed a short course of pain medicine today.  You can take this with your Zofran  if you have any nausea or vomiting from the acetaminophen that is in the medicine.  Do not mix with other products containing acetaminophen, do not with alcohol or other illicit drugs, do not drive or operate machinery, and refrain from any activity that will require complete attention while taking this medication.  You should NOT take this medication with your Ambien.  By [place] law, I am legally able to only prescribe you a short course of pain medicine.  If more is needed you will need to follow-up with your PCP or orthopedics for further refills.

## 2023-12-21 NOTE — ED Notes (Signed)
 Patient was offered a padded ace wrap but declined. Provider aware.
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
## 2023-12-22 ENCOUNTER — Ambulatory Visit

## 2023-12-22 DIAGNOSIS — M25572 Pain in left ankle and joints of left foot: Secondary | ICD-10-CM | POA: Diagnosis not present

## 2023-12-22 DIAGNOSIS — M79672 Pain in left foot: Secondary | ICD-10-CM

## 2023-12-22 DIAGNOSIS — M25472 Effusion, left ankle: Secondary | ICD-10-CM
